# Patient Record
Sex: Female | Born: 1995 | Race: White | Hispanic: No | Marital: Single | Smoking: Never smoker
Health system: Southern US, Community
[De-identification: ages and names within clinical notes are randomized; demographics above are authoritative.]

---
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
## 2014-11-12 ENCOUNTER — Encounter: Payer: Self-pay | Admitting: Podiatry

## 2014-11-12 ENCOUNTER — Ambulatory Visit (INDEPENDENT_AMBULATORY_CARE_PROVIDER_SITE_OTHER): Payer: Commercial Managed Care - PPO

## 2014-11-12 ENCOUNTER — Ambulatory Visit (INDEPENDENT_AMBULATORY_CARE_PROVIDER_SITE_OTHER): Payer: Commercial Managed Care - PPO | Admitting: Podiatry

## 2014-11-12 VITALS — BP 111/65 | HR 88 | Resp 16 | Ht 62.0 in | Wt 152.0 lb

## 2014-11-12 DIAGNOSIS — S93402A Sprain of unspecified ligament of left ankle, initial encounter: Secondary | ICD-10-CM

## 2014-11-12 DIAGNOSIS — M779 Enthesopathy, unspecified: Secondary | ICD-10-CM

## 2014-11-12 DIAGNOSIS — M722 Plantar fascial fibromatosis: Secondary | ICD-10-CM

## 2014-11-12 NOTE — Patient Instructions (Signed)
Plantar Fasciitis (Heel Spur Syndrome) with Rehab The plantar fascia is a fibrous, ligament-like, soft-tissue structure that spans the bottom of the foot. Plantar fasciitis is a condition that causes pain in the foot due to inflammation of the tissue. SYMPTOMS   Pain and tenderness on the underneath side of the foot.  Pain that worsens with standing or walking. CAUSES  Plantar fasciitis is caused by irritation and injury to the plantar fascia on the underneath side of the foot. Common mechanisms of injury include:  Direct trauma to bottom of the foot.  Damage to a small nerve that runs under the foot where the main fascia attaches to the heel bone.  Stress placed on the plantar fascia due to bone spurs. RISK INCREASES WITH:   Activities that place stress on the plantar fascia (running, jumping, pivoting, or cutting).  Poor strength and flexibility.  Improperly fitted shoes.  Tight calf muscles.  Flat feet.  Failure to warm-up properly before activity.  Obesity. PREVENTION  Warm up and stretch properly before activity.  Allow for adequate recovery between workouts.  Maintain physical fitness:  Strength, flexibility, and endurance.  Cardiovascular fitness.  Maintain a health body weight.  Avoid stress on the plantar fascia.  Wear properly fitted shoes, including arch supports for individuals who have flat feet. PROGNOSIS  If treated properly, then the symptoms of plantar fasciitis usually resolve without surgery. However, occasionally surgery is necessary. RELATED COMPLICATIONS   Recurrent symptoms that may result in a chronic condition.  Problems of the lower back that are caused by compensating for the injury, such as limping.  Pain or weakness of the foot during push-off following surgery.  Chronic inflammation, scarring, and partial or complete fascia tear, occurring more often from repeated injections. TREATMENT  Treatment initially involves the use of  ice and medication to help reduce pain and inflammation. The use of strengthening and stretching exercises may help reduce pain with activity, especially stretches of the Achilles tendon. These exercises may be performed at home or with a therapist. Your caregiver may recommend that you use heel cups of arch supports to help reduce stress on the plantar fascia. Occasionally, corticosteroid injections are given to reduce inflammation. If symptoms persist for greater than 6 months despite non-surgical (conservative), then surgery may be recommended.  MEDICATION   If pain medication is necessary, then nonsteroidal anti-inflammatory medications, such as aspirin and ibuprofen, or other minor pain relievers, such as acetaminophen, are often recommended.  Do not take pain medication within 7 days before surgery.  Prescription pain relievers may be given if deemed necessary by your caregiver. Use only as directed and only as much as you need.  Corticosteroid injections may be given by your caregiver. These injections should be reserved for the most serious cases, because they may only be given a certain number of times. HEAT AND COLD  Cold treatment (icing) relieves pain and reduces inflammation. Cold treatment should be applied for 10 to 15 minutes every 2 to 3 hours for inflammation and pain and immediately after any activity that aggravates your symptoms. Use ice packs or massage the area with a piece of ice (ice massage).  Heat treatment may be used prior to performing the stretching and strengthening activities prescribed by your caregiver, physical therapist, or athletic trainer. Use a heat pack or soak the injury in warm water. SEEK IMMEDIATE MEDICAL CARE IF:  Treatment seems to offer no benefit, or the condition worsens.  Any medications produce adverse side effects. EXERCISES RANGE   OF MOTION (ROM) AND STRETCHING EXERCISES - Plantar Fasciitis (Heel Spur Syndrome) These exercises may help you  when beginning to rehabilitate your injury. Your symptoms may resolve with or without further involvement from your physician, physical therapist or athletic trainer. While completing these exercises, remember:   Restoring tissue flexibility helps normal motion to return to the joints. This allows healthier, less painful movement and activity.  An effective stretch should be held for at least 30 seconds.  A stretch should never be painful. You should only feel a gentle lengthening or release in the stretched tissue. RANGE OF MOTION - Toe Extension, Flexion  Sit with your right / left leg crossed over your opposite knee.  Grasp your toes and gently pull them back toward the top of your foot. You should feel a stretch on the bottom of your toes and/or foot.  Hold this stretch for __________ seconds.  Now, gently pull your toes toward the bottom of your foot. You should feel a stretch on the top of your toes and or foot.  Hold this stretch for __________ seconds. Repeat __________ times. Complete this stretch __________ times per day.  RANGE OF MOTION - Ankle Dorsiflexion, Active Assisted  Remove shoes and sit on a chair that is preferably not on a carpeted surface.  Place right / left foot under knee. Extend your opposite leg for support.  Keeping your heel down, slide your right / left foot back toward the chair until you feel a stretch at your ankle or calf. If you do not feel a stretch, slide your bottom forward to the edge of the chair, while still keeping your heel down.  Hold this stretch for __________ seconds. Repeat __________ times. Complete this stretch __________ times per day.  STRETCH - Gastroc, Standing  Place hands on wall.  Extend right / left leg, keeping the front knee somewhat bent.  Slightly point your toes inward on your back foot.  Keeping your right / left heel on the floor and your knee straight, shift your weight toward the wall, not allowing your back to  arch.  You should feel a gentle stretch in the right / left calf. Hold this position for __________ seconds. Repeat __________ times. Complete this stretch __________ times per day. STRETCH - Soleus, Standing  Place hands on wall.  Extend right / left leg, keeping the other knee somewhat bent.  Slightly point your toes inward on your back foot.  Keep your right / left heel on the floor, bend your back knee, and slightly shift your weight over the back leg so that you feel a gentle stretch deep in your back calf.  Hold this position for __________ seconds. Repeat __________ times. Complete this stretch __________ times per day. STRETCH - Gastrocsoleus, Standing  Note: This exercise can place a lot of stress on your foot and ankle. Please complete this exercise only if specifically instructed by your caregiver.   Place the ball of your right / left foot on a step, keeping your other foot firmly on the same step.  Hold on to the wall or a rail for balance.  Slowly lift your other foot, allowing your body weight to press your heel down over the edge of the step.  You should feel a stretch in your right / left calf.  Hold this position for __________ seconds.  Repeat this exercise with a slight bend in your right / left knee. Repeat __________ times. Complete this stretch __________ times per day.    STRENGTHENING EXERCISES - Plantar Fasciitis (Heel Spur Syndrome)  These exercises may help you when beginning to rehabilitate your injury. They may resolve your symptoms with or without further involvement from your physician, physical therapist or athletic trainer. While completing these exercises, remember:   Muscles can gain both the endurance and the strength needed for everyday activities through controlled exercises.  Complete these exercises as instructed by your physician, physical therapist or athletic trainer. Progress the resistance and repetitions only as guided. STRENGTH -  Towel Curls  Sit in a chair positioned on a non-carpeted surface.  Place your foot on a towel, keeping your heel on the floor.  Pull the towel toward your heel by only curling your toes. Keep your heel on the floor.  If instructed by your physician, physical therapist or athletic trainer, add ____________________ at the end of the towel. Repeat __________ times. Complete this exercise __________ times per day. STRENGTH - Ankle Inversion  Secure one end of a rubber exercise band/tubing to a fixed object (table, pole). Loop the other end around your foot just before your toes.  Place your fists between your knees. This will focus your strengthening at your ankle.  Slowly, pull your big toe up and in, making sure the band/tubing is positioned to resist the entire motion.  Hold this position for __________ seconds.  Have your muscles resist the band/tubing as it slowly pulls your foot back to the starting position. Repeat __________ times. Complete this exercises __________ times per day.  Document Released: 10/16/2005 Document Revised: 01/08/2012 Document Reviewed: 01/28/2009 [employer] Patient Information 2015 [employer]. This information is not intended to replace advice given to you by your health care provider. Make sure you discuss any questions you have with your health care provider.  

## 2014-11-12 NOTE — Progress Notes (Signed)
Subjective:    Patient ID: Mary Macdonald, female    DOB: 21-Jul-1996, 21 y.o.   MRN: [id number]  HPI Comments: 19 year old female presents the office they with complaints of lateral left ankle pain as well as bilateral heel pain. She states that approximate one month ago she fell twisting her ankle and since she has had some discomfort along the outside aspect of the ankle. She points to the area of the distal fibula. She states that currently she does not have much symptoms however as she is walking around school the pain increases. She states that after her injury she had significant bruising to the ankle as well as the toes. She is also been spinning which seems to increase in symptoms.  She also states that she has pain to bilateral heels which is been ongoing for approximate one week. She states that she has pain in the heels after long periods of ambulation. She has no pain in the mornings. She denies any recent injury or trauma to this particular area. She's been using ice and elevating the area. No other complaints at this time.  Foot Pain      Review of Systems  Musculoskeletal:       Joint pain Difficulty walking Muscle pain  All other systems reviewed and are negative.      Objective:   Physical Exam AAO x3, NAD DP/PT pulses palpable 2/4 b/l, CRT < 3 sec Protective sensation intact with Simms Weinstein monofilament, vibratory sensation intact, Achilles tendon reflex intact. There is mild tenderness to palpation of the distal aspect of the left fibula and along the ATFL. There is mild tenderness along the course of the peroneal tendons along its insertion into the fifth metatarsal base, and there is tenderness palpation of the fifth metatarsal base. There is mild edema overlying the lateral aspect of the foot and ankle. There is pain with eversion. There is no proximal fibular pain, medial malleolus pain. No pain along the syndesmosis, deltoid ligaments.  There is  tenderness palpation overlying the plantar medial tubercle bilateral calcaneus at the insertion the plantar fascia. There is no pain with lateral compression of the calcaneus or pain the vibratory sensation bilaterally. There is no pain on the course of the Achilles tendon or along the posterior aspect of the calcaneus. There is no pain on the course of plantar fascial in the arch of the foot and the ligament appears to be intact. No other areas of pinpoint bony tenderness or pain the vibratory sensation bilaterally. No open lesions or pre-ulcerative lesions No pain with calf compression, swelling, warmth, erythema.        Assessment & Plan:  19 year old female with lateral left ankle pain, peroneal tendinitis; bilateral plantar fasciitis -X-rays were obtained and reviewed the patient. On the oblique x-ray of the left ankle there is a radiolucent line with the distal aspect of the fibula. This could represent a healing growth plate versus old fracture. However, given the nature of the patient's injury (twisting of the ankle), with pain overlying the area/ swelling, and walking a lot around campus [facility])  will immobilize and a CAM walker for 2 weeks. Immobilization should also help the heel pain/plantar fasciitis. Follow-up x-rays in 2 weeks. -In regards the heel pain discussed most likely etiology is plantar fasciitis. X-rays reviewed with the patient. At this time discussed possible steroid injection into the area however the patient wishes to hold off. Discussed other treatments which include shoe modification/orthotics, stretching, icing, anti-inflammatories as  needed. Dispense plantar fascial brace for the right. -Follow-up in 2 weeks for repeat x-rays or call sooner with any questions, concerns, change in symptoms. In 2 weeks if his symptoms have resolved to the left ankle will likely start home physical therapy for peroneal tendinitis and plantar fasciitis for that side.

## 2014-11-26 ENCOUNTER — Ambulatory Visit (INDEPENDENT_AMBULATORY_CARE_PROVIDER_SITE_OTHER): Payer: 59

## 2014-11-26 ENCOUNTER — Ambulatory Visit (INDEPENDENT_AMBULATORY_CARE_PROVIDER_SITE_OTHER): Payer: 59 | Admitting: Podiatry

## 2014-11-26 ENCOUNTER — Ambulatory Visit: Payer: Commercial Managed Care - PPO | Admitting: Podiatry

## 2014-11-26 ENCOUNTER — Encounter: Payer: Self-pay | Admitting: Podiatry

## 2014-11-26 VITALS — BP 105/70 | HR 72 | Resp 18

## 2014-11-26 DIAGNOSIS — R52 Pain, unspecified: Secondary | ICD-10-CM

## 2014-11-26 DIAGNOSIS — M779 Enthesopathy, unspecified: Secondary | ICD-10-CM

## 2014-11-26 MED ORDER — DICLOFENAC SODIUM 1 % TD GEL: 2.0000 g | Freq: Four times a day (QID) | TRANSDERMAL | Status: DC

## 2014-12-01 NOTE — Progress Notes (Signed)
Patient ID: Mary Macdonald, female   DOB: [birth year], 19 y.o.   MRN: [id number]  Subjective: 19 year old female returns the office they fall evaluation of left ankle pain as well as bilateral heel pain. She states that her heels up significantly improved compared to last appointment and she has no discomfort at this time. She states of the ankles also doing well on the left side however she does continue to have some discomfort on the top of her foot for which she points to the more lateral aspect of the left foot. She's been continuing the CAM walker. Denies any systemic complaints such as fevers, chills, nausea, vomiting. No acute changes since last appointment, and no other complaints at this time.   Objective: AAO x3, NAD DP/PT pulses palpable bilaterally, CRT less than 3 seconds Protective sensation intact with Simms Weinstein monofilament, vibratory sensation intact, Achilles tendon reflex intact There is mild discomfort along the course in the insertion of the peroneal tendon on the left foot. There is no pain with eversion or range of motion. The peroneal tendons appear to be intact. There is no tenderness along the plantar medial tubercle of the calcaneus at the insertion the plantar fascia or with lateral compression of the calcaneus bilaterally. No pain on the posterior aspect of the calcaneus bilaterally. There is no tenderness along the course of the fibula, tibia, ankle ligaments. Ankle joint range of motion is intact. No other areas of pinpoint bony tenderness or pain with vibratory sensation to bilateral lower extremities. MMT 5/5, ROM WNL. No edema, erythema, increase in warmth to bilateral lower extremities.  No open lesions or pre-ulcerative lesions.  No pain with calf compression, swelling, warmth, erythema  Assessment: 19 year old female with resolved bilateral heel pain, likely plantar fasciitis; likely left foot peroneal tendinitis  Plan: -X-rays were obtained and reviewed  of the left foot. -All treatment options discussed with the patient including all alternatives, risks, complications.  -At this time she consented transition back into a regular shoe as tolerated. I discussed with her stretching/strengthening exercises of the peroneal tendons. Continue with ice. Prescribed Voltaren gel. -Continue stretching exercises for plantar fasciitis and ice to the area. Continue with plantar fascial brace as needed. -Follow-up at the symptoms are not resolved within 4 weeks or sooner should any problems arise. -Patient encouraged to call the office with any questions, concerns, change in symptoms.

## 2017-12-31 ENCOUNTER — Ambulatory Visit
Admission: RE | Admit: 2017-12-31 | Discharge: 2017-12-31 | Disposition: A | Discharge status: Home / Self Care | Payer: BC Managed Care – PPO | Source: Ambulatory Visit | Attending: Family Medicine | Admitting: Family Medicine

## 2017-12-31 ENCOUNTER — Other Ambulatory Visit: Payer: Self-pay | Admitting: Family Medicine

## 2017-12-31 DIAGNOSIS — R059 Cough, unspecified: Secondary | ICD-10-CM

## 2017-12-31 DIAGNOSIS — R918 Other nonspecific abnormal finding of lung field: Secondary | ICD-10-CM | POA: Diagnosis not present

## 2017-12-31 DIAGNOSIS — R05 Cough: Secondary | ICD-10-CM

## 2019-08-12 ENCOUNTER — Other Ambulatory Visit: Payer: Self-pay

## 2019-08-12 DIAGNOSIS — Z20822 Contact with and (suspected) exposure to covid-19: Secondary | ICD-10-CM

## 2019-08-14 LAB — NOVEL CORONAVIRUS, NAA: SARS-CoV-2, NAA: NOT DETECTED

## 2020-01-29 IMAGING — CR DG CHEST 2V
2 series · 2 of 2 positions shown · non-contrast
Comparison: None.

CLINICAL DATA: Cough.

EXAM:
CHEST  2 VIEW

[chest pa]
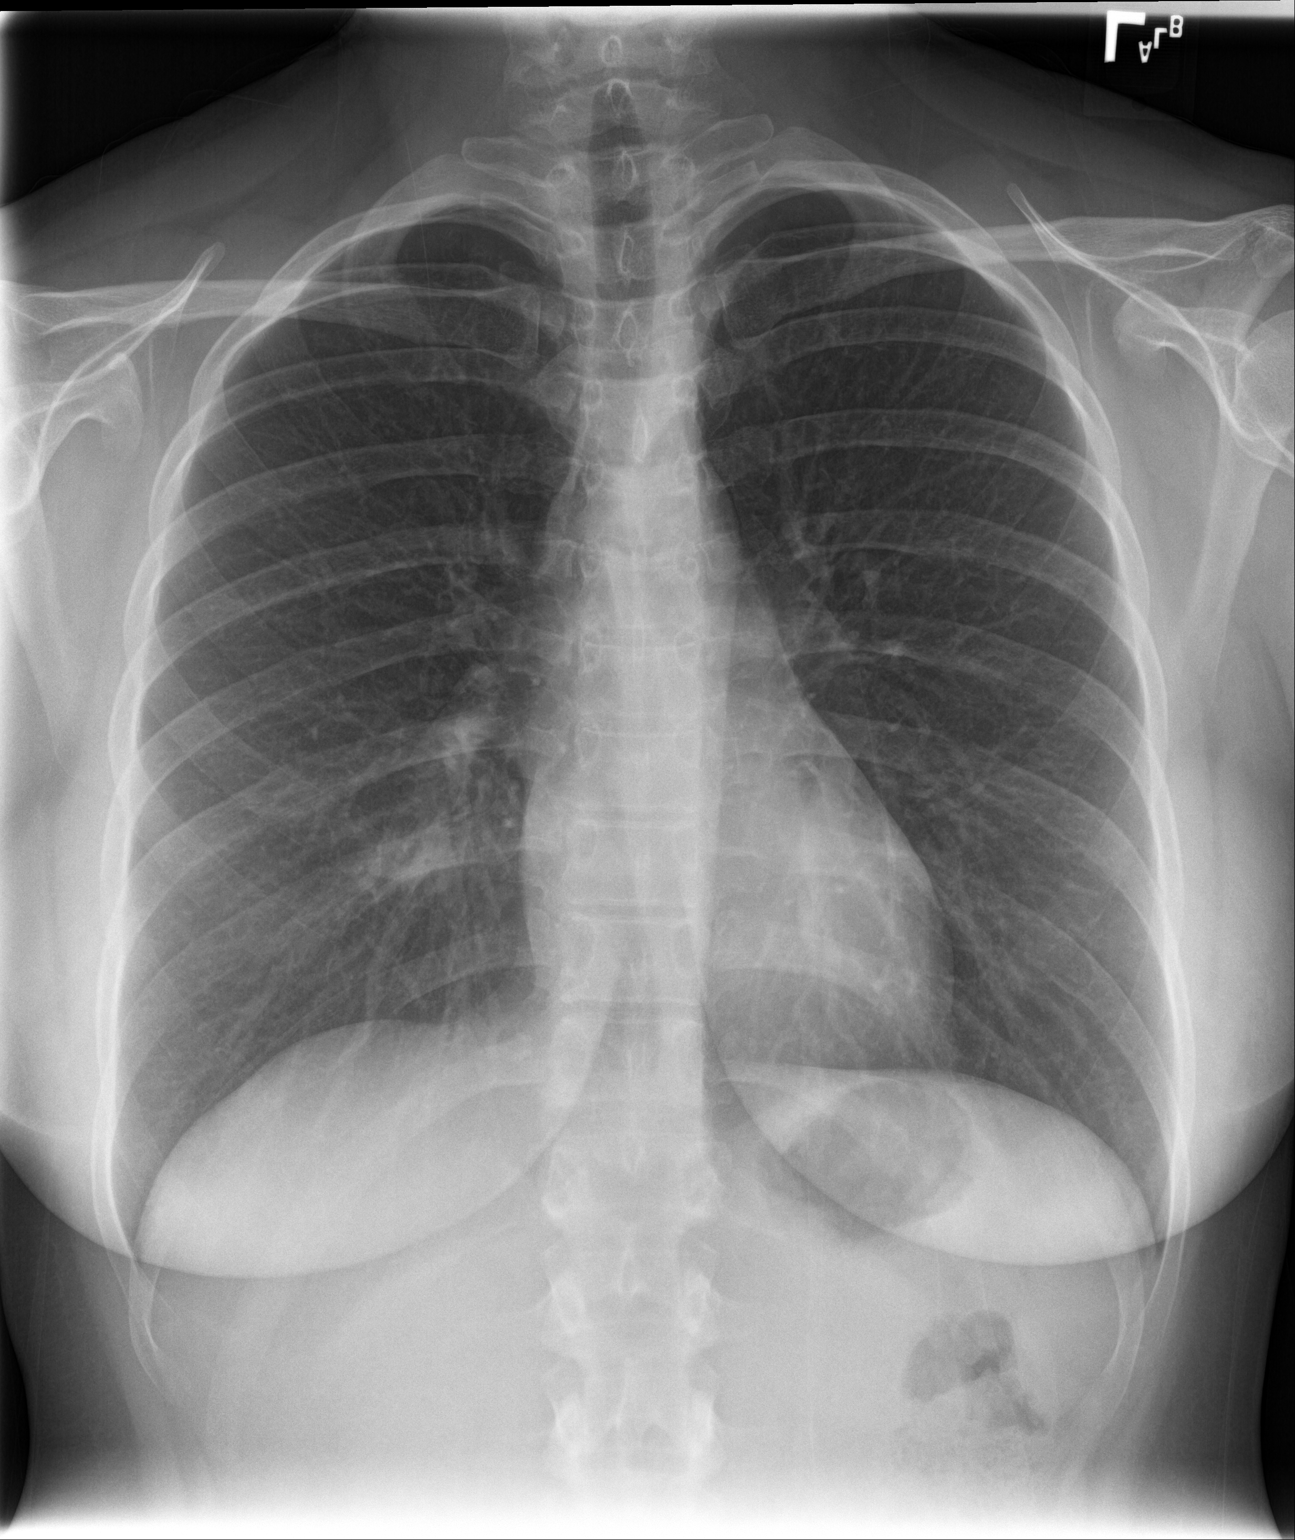

[chest lat]
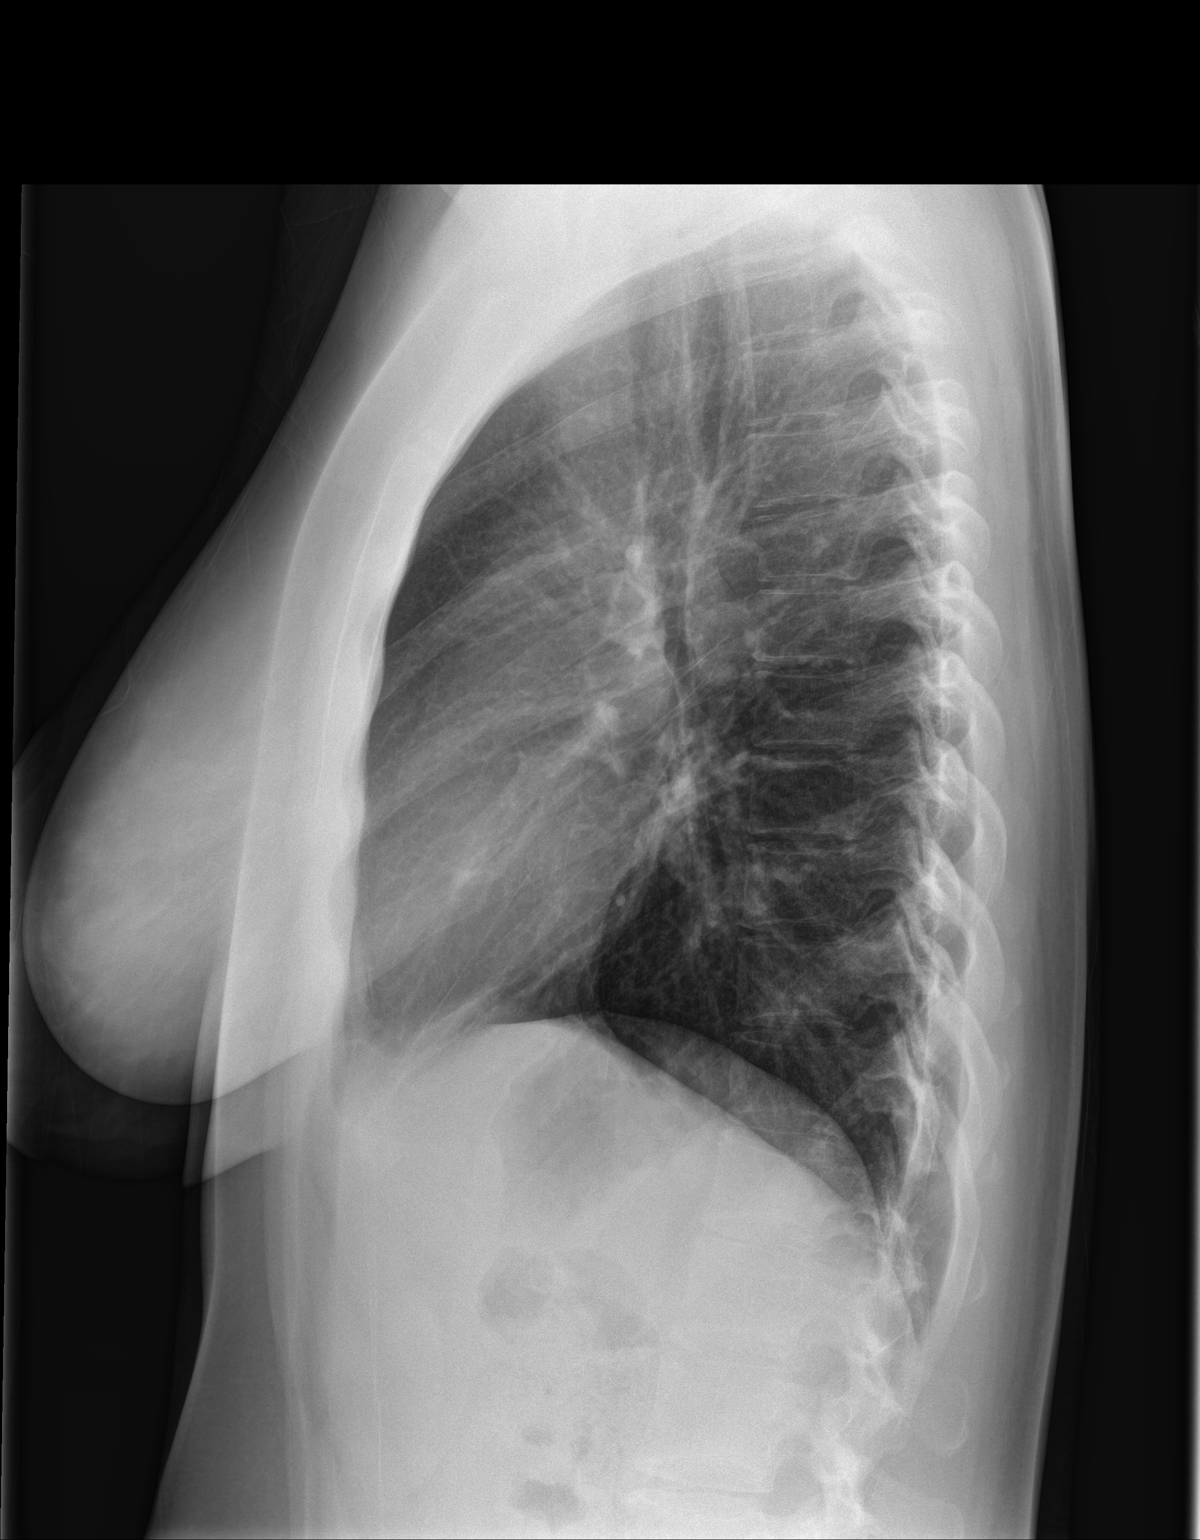

[2 of 2 positions shown; findings below may reference images not displayed]

FINDINGS: The cardiomediastinal contours are normal. Small focal opacity in
the right middle lobe. Pulmonary vasculature is normal. No pleural
effusion or pneumothorax. No acute osseous abnormalities are seen.
IMPRESSION: Small right middle lobe opacity may be atelectasis or pneumonia in
the setting of cough.

## 2020-04-14 ENCOUNTER — Ambulatory Visit: Payer: BC Managed Care – PPO | Admitting: Podiatry

## 2020-04-15 ENCOUNTER — Ambulatory Visit: Payer: BC Managed Care – PPO | Admitting: Podiatry

## 2020-04-15 ENCOUNTER — Encounter: Payer: Self-pay | Admitting: Podiatry

## 2020-04-15 ENCOUNTER — Ambulatory Visit (INDEPENDENT_AMBULATORY_CARE_PROVIDER_SITE_OTHER): Payer: BC Managed Care – PPO

## 2020-04-15 ENCOUNTER — Other Ambulatory Visit: Payer: Self-pay

## 2020-04-15 DIAGNOSIS — M778 Other enthesopathies, not elsewhere classified: Secondary | ICD-10-CM

## 2020-04-15 DIAGNOSIS — M7672 Peroneal tendinitis, left leg: Secondary | ICD-10-CM

## 2020-04-16 ENCOUNTER — Encounter: Payer: Self-pay | Admitting: Podiatry

## 2020-04-16 NOTE — Progress Notes (Signed)
Subjective:  Patient ID: Mary Macdonald, female    DOB: [birth year],  MRN: [id number]  Chief Complaint  Patient presents with  . Plantar Warts    i have a spot on the ball of the right foot and hurts with walking   . Foot Problem    the left foot hurts on the outside    24 y.o. female presents with the above complaint.  Patient presents with complaint of left lateral foot hurting a lot on the side.  Patient states been going on since January 2021.  Is bad at the end of the school day.  There is some soreness and tenderness associated with it.  Patient states it hurts when working out.  There is no burning.  There is some throbbing sensation.  It hurts to walk and while stretching.  She heard a pop that is how she feels.  No swelling associated with it.  No injury.  Not diabetic.  She denies any other acute complaints.  She has not seen anyone else prior to see me.  Her pain scale 6 out of 10.   Review of Systems: Negative except as noted in the HPI. Denies N/V/F/Ch.  History reviewed. No pertinent past medical history.  Current Outpatient Medications:  .  nortriptyline (PAMELOR) 25 MG capsule, Take by mouth., Disp: , Rfl:  .  Magnesium Oxide 500 MG TABS, Take by mouth., Disp: , Rfl:  .  SUMAtriptan (IMITREX) 100 MG tablet, Take by mouth., Disp: , Rfl:  .  Topiramate ER (TROKENDI XR) 100 MG CP24, Take by mouth., Disp: , Rfl:   Social History   Tobacco Use  Smoking Status Never Smoker  Smokeless Tobacco Never Used    No Known Allergies Objective:  There were no vitals filed for this visit. There is no height or weight on file to calculate BMI. Constitutional Well developed. Well nourished.  Vascular Dorsalis pedis pulses palpable bilaterally. Posterior tibial pulses palpable bilaterally. Capillary refill normal to all digits.  No cyanosis or clubbing noted. Pedal hair growth normal.  Neurologic Normal speech. Oriented to person, place, and time. Epicritic sensation to  light touch grossly present bilaterally.  Dermatologic Nails well groomed and normal in appearance. No open wounds. No skin lesions.  Orthopedic:  Pain on palpation along the course of the left peroneal tendon.  Pain at the insertion as well as along the course of the tendon.  No retromalleolar peroneal tendon pain noted.  Pain with plantarflexion and inversion of the left foot.  No pain with dorsiflexion and eversion of the foot.  These findings were consistent with active and passive range of motion.   Radiographs: 3 views of skeletally mature adult left foot:No osseous abnormalities noted.  No fractures noted.  No other bony on abnormalities identified.  Spurring noted  Assessment:   1. Capsulitis of foot, left   2. Peroneal tendinitis of left lower extremity    Plan:  Patient was evaluated and treated and all questions answered.  Left peroneal tendinitis -I explained patient the etiology of peroneal tendinitis and various treatment options were extensively discussed.  Given that she is constantly on her feet I think believe that the tendon may have been aggravated.  Given her she has moderate to severe pain.  I believe patient will benefit from a steroid injection to help decrease the acute inflammatory component associated pain.  I did discuss with the patient that there is a risk of rupture associated with steroid injection near the tendon.  Patient would like to proceed with injection despite the risks.  At this time I will hold off on a cam boot immobilization however if there is no improvement with a steroid injection we will plan on a cam boot immobilization. -A steroid injection was performed at left lateral foot a point of maximal tenderness using 1% plain Lidocaine and 10 mg of Kenalog. This was well tolerated.   No follow-ups on file.

## 2020-05-18 ENCOUNTER — Ambulatory Visit: Payer: BC Managed Care – PPO | Admitting: Podiatry

## 2020-05-18 ENCOUNTER — Other Ambulatory Visit: Payer: Self-pay

## 2020-05-18 DIAGNOSIS — M7672 Peroneal tendinitis, left leg: Secondary | ICD-10-CM

## 2020-05-19 ENCOUNTER — Encounter: Payer: Self-pay | Admitting: Podiatry

## 2020-05-19 NOTE — Progress Notes (Signed)
  Subjective:  Patient ID: Mary Macdonald, female    DOB: September 25, 1996,  MRN: [id number]  Chief Complaint  Patient presents with  . Foot Pain    pt is here for possible capsulitis of the left foot.    24 y.o. female presents with the above complaint.  Patient presents with continuous pain to the left peroneal tendinitis.  Patient states the injection did not help.  Patient states the cam boot immobilization has not done anything either.  She states that she got new shoes which seems to help a little bit more.  Her pain scale 7 out of 10.  Patient does not want any further injections.  She would like to discuss what her next options are.   Review of Systems: Negative except as noted in the HPI. Denies N/V/F/Ch.  No past medical history on file.  Current Outpatient Medications:  .  EPIDUO FORTE 0.3-2.5 % GEL, Apply 1 application topically at bedtime., Disp: , Rfl:  .  Magnesium Oxide 500 MG TABS, Take by mouth., Disp: , Rfl:  .  nortriptyline (PAMELOR) 25 MG capsule, Take by mouth., Disp: , Rfl:  .  SUMAtriptan (IMITREX) 100 MG tablet, Take by mouth., Disp: , Rfl:  .  Topiramate ER (TROKENDI XR) 100 MG CP24, Take by mouth., Disp: , Rfl:   Social History   Tobacco Use  Smoking Status Never Smoker  Smokeless Tobacco Never Used    No Known Allergies Objective:  There were no vitals filed for this visit. There is no height or weight on file to calculate BMI. Constitutional Well developed. Well nourished.  Vascular Dorsalis pedis pulses palpable bilaterally. Posterior tibial pulses palpable bilaterally. Capillary refill normal to all digits.  No cyanosis or clubbing noted. Pedal hair growth normal.  Neurologic Normal speech. Oriented to person, place, and time. Epicritic sensation to light touch grossly present bilaterally.  Dermatologic Nails well groomed and normal in appearance. No open wounds. No skin lesions.  Orthopedic:  Pain on palpation along the course of the left  peroneal tendon.  Pain at the insertion as well as along the course of the tendon.  No retromalleolar peroneal tendon pain noted.  Pain with plantarflexion and inversion of the left foot.  No pain with dorsiflexion and eversion of the foot.  These findings were consistent with active and passive range of motion.   Radiographs: 3 views of skeletally mature adult left foot:No osseous abnormalities noted.  No fractures noted.  No other bony on abnormalities identified.  Spurring noted  Assessment:   1. Peroneal tendinitis of left lower extremity    Plan:  Patient was evaluated and treated and all questions answered.  Left peroneal tendinitis -Patient had placed herself in the cam boot because of continuous pain which helped a little bit.  The shoe gear modification has also helped.  Given clinically that her pain has not resolved I am worried about the longitudinal tearing along the course of the peroneal tendon. -I believe patient will benefit from MRI to evaluate tearing of the peroneal tendon of the left side.  Patient agrees with the plan would like to proceed with obtaining MRI. -She will continue wearing the cam boot in the meantime. -I will see her back again in 4 weeks.    No follow-ups on file.

## 2020-05-21 ENCOUNTER — Telehealth: Payer: Self-pay

## 2020-05-21 DIAGNOSIS — M7672 Peroneal tendinitis, left leg: Secondary | ICD-10-CM

## 2020-05-21 NOTE — Telephone Encounter (Signed)
-----   Message from Candelaria Stagers, DPM sent at 05/19/2020  7:19 AM EDT ----- Regarding: MRI left ankle Hi Angie,  Would you be able to order MRI of the left ankle to evaluate the peroneal tendon to see if there is any tendon tearing along it.  Thank you

## 2020-05-21 NOTE — Telephone Encounter (Signed)
MRI approved from 05/21/2020 to 11/16/2020 Auth # [id number] Patient has been notified and will request the scheduling number through Mychart to call and schedule at her convenience

## 2020-06-15 ENCOUNTER — Ambulatory Visit: Payer: BC Managed Care – PPO | Admitting: Podiatry

## 2020-06-17 ENCOUNTER — Ambulatory Visit: Admission: RE | Admit: 2020-06-17 | Payer: BC Managed Care – PPO | Source: Ambulatory Visit

## 2020-06-17 ENCOUNTER — Other Ambulatory Visit: Payer: Self-pay

## 2020-07-06 ENCOUNTER — Ambulatory Visit: Payer: BC Managed Care – PPO | Admitting: Podiatry

## 2020-09-08 ENCOUNTER — Other Ambulatory Visit: Payer: Self-pay

## 2020-09-08 ENCOUNTER — Ambulatory Visit
Admission: EM | Admit: 2020-09-08 | Discharge: 2020-09-08 | Disposition: A | Discharge status: Home / Self Care | Payer: BC Managed Care – PPO | Attending: Emergency Medicine | Admitting: Emergency Medicine

## 2020-09-08 ENCOUNTER — Encounter: Payer: Self-pay | Admitting: Emergency Medicine

## 2020-09-08 DIAGNOSIS — Z79899 Other long term (current) drug therapy: Secondary | ICD-10-CM | POA: Insufficient documentation

## 2020-09-08 DIAGNOSIS — R000 Tachycardia, unspecified: Secondary | ICD-10-CM | POA: Diagnosis not present

## 2020-09-08 DIAGNOSIS — Z20822 Contact with and (suspected) exposure to covid-19: Secondary | ICD-10-CM | POA: Insufficient documentation

## 2020-09-08 DIAGNOSIS — J029 Acute pharyngitis, unspecified: Secondary | ICD-10-CM | POA: Insufficient documentation

## 2020-09-08 DIAGNOSIS — H66002 Acute suppurative otitis media without spontaneous rupture of ear drum, left ear: Secondary | ICD-10-CM | POA: Insufficient documentation

## 2020-09-08 DIAGNOSIS — Z8616 Personal history of COVID-19: Secondary | ICD-10-CM | POA: Insufficient documentation

## 2020-09-08 LAB — SARS CORONAVIRUS 2 (TAT 6-24 HRS): SARS Coronavirus 2: NEGATIVE

## 2020-09-08 LAB — GROUP A STREP BY PCR: Group A Strep by PCR: NOT DETECTED

## 2020-09-08 MED ORDER — AMOXICILLIN-POT CLAVULANATE 875-125 MG PO TABS: 1.0000 | ORAL_TABLET | Freq: Two times a day (BID) | ORAL | 0 refills | Status: AC

## 2020-09-08 NOTE — ED Triage Notes (Signed)
Pt c/o sore throat, cough, bilateral ear pain left worse than right, post nasal drip and nasal congestion. Started about 4 days ago. She states she went to her PCP yesterday and had a strep test but she does not believe they did it right. She has had covid vaccines.

## 2020-09-08 NOTE — ED Provider Notes (Signed)
[facility]    CSN: [id number] Arrival date & time: 09/08/20  8413      History   Chief Complaint Chief Complaint  Patient presents with  . Sore Throat  . Otalgia    bilateral L>R    HPI Mary Macdonald is a 24 y.o. female.   Mary Macdonald presents with complaints of sore throat and left ear pain. Sore throat started initially 3 days ago. Last night she noted ear "popping" and pressure. Decreased hearing from left ear. Right ear feels congested. No fever. No headache or body aches. No cough. Some nasal drainage. No gi symptoms. No skin rash. Has been taking sudafed as well as cough medication, dayquil/nyquil, honey, tea, compresses to ears, gargling, all which haven't helped. History of strep throat, typically twice a year. No known ill contacts. Works as a Runner, broadcasting/film/video. Has had covid-19 and has been vaccinated for covid-19. Has had tachycardia since having covid-19.     ROS per HPI, negative if not otherwise mentioned.      History reviewed. No pertinent past medical history.  There are no problems to display for this patient.   History reviewed. No pertinent surgical history.  OB History   No obstetric history on file.      Home Medications    Prior to Admission medications   Medication Sig Start Date End Date Taking? Authorizing Provider  norethindrone (MICRONOR) 0.35 MG tablet  09/01/20  Yes [provider]  SUMAtriptan (IMITREX) 100 MG tablet Take by mouth.   Yes [provider]  amoxicillin-clavulanate (AUGMENTIN) 875-125 MG tablet Take 1 tablet by mouth 2 (two) times daily for 5 days. 09/08/20 09/13/20  Georgetta Haber, NP  EPIDUO FORTE 0.3-2.5 % GEL Apply 1 application topically at bedtime. 04/05/20   [provider]  Magnesium Oxide 500 MG TABS Take by mouth.    [provider]  nortriptyline (PAMELOR) 25 MG capsule Take by mouth. 12/04/18   [provider]  Topiramate ER (TROKENDI XR) 100 MG CP24 Take  by mouth.    [provider]    Family History Family History  Problem Relation Age of Onset  . Renal Disease Mother   . Arthritis Mother   . Diabetes Mother   . Healthy Father     Social History Social History   Tobacco Use  . Smoking status: Never Smoker  . Smokeless tobacco: Never Used  Vaping Use  . Vaping Use: Never used  Substance Use Topics  . Alcohol use: No    Alcohol/week: 0.0 standard drinks  . Drug use: Not Currently     Allergies   Patient has no known allergies.   Review of Systems Review of Systems   Physical Exam Triage Vital Signs ED Triage Vitals  Enc Vitals Group     BP 09/08/20 0902 112/69     Pulse Rate 09/08/20 0902 (!) 108     Resp 09/08/20 0902 18     Temp 09/08/20 0902 98.8 F (37.1 C)     Temp Source 09/08/20 0902 Oral     SpO2 09/08/20 0902 99 %     Weight 09/08/20 0858 151 lb 14.4 oz (68.9 kg)     Height 09/08/20 0858 5\' 2"  (1.575 m)     Head Circumference --      Peak Flow --      Pain Score 09/08/20 0857 7     Pain Loc --      Pain Edu? --  Excl. in GC? --    No data found.  Updated Vital Signs BP 112/69 (BP Location: Left Arm)   Pulse (!) 108   Temp 98.8 F (37.1 C) (Oral)   Resp 18   Ht 5\' 2"  (1.575 m)   Wt 151 lb 14.4 oz (68.9 kg)   LMP 08/24/2020   SpO2 99%   BMI 27.78 kg/m   Visual Acuity Right Eye Distance:   Left Eye Distance:   Bilateral Distance:    Right Eye Near:   Left Eye Near:    Bilateral Near:     Physical Exam Constitutional:      General: She is not in acute distress.    Appearance: She is well-developed.  HENT:     Right Ear: Ear canal normal. A middle ear effusion is present.     Left Ear: Ear canal normal. A middle ear effusion is present. Tympanic membrane is erythematous and bulging.     Mouth/Throat:     Pharynx: Posterior oropharyngeal erythema present.     Tonsils: No tonsillar exudate. 1+ on the right. 1+ on the left.  Cardiovascular:     Rate and Rhythm:  Normal rate.  Pulmonary:     Effort: Pulmonary effort is normal.  Skin:    General: Skin is warm and dry.  Neurological:     Mental Status: She is alert and oriented to person, place, and time.      UC Treatments / Results  Labs (all labs ordered are listed, but only abnormal results are displayed) Labs Reviewed  GROUP A STREP BY PCR  SARS CORONAVIRUS 2 (TAT 6-24 HRS)    EKG   Radiology No results found.  Procedures Procedures (including critical care time)  Medications Ordered in UC Medications - No data to display  Initial Impression / Assessment and Plan / UC Course  I have reviewed the triage vital signs and the nursing notes.  Pertinent labs & imaging results that were available during my care of the patient were reviewed by me and considered in my medical decision making (see chart for details).     Left AOM with augmentin provided. Negative strep testing, covid testing pending. Return precautions provided. Patient verbalized understanding and agreeable to plan.   Final Clinical Impressions(s) / UC Diagnoses   Final diagnoses:  Acute suppurative otitis media of left ear without spontaneous rupture of tympanic membrane, recurrence not specified     Discharge Instructions     Push fluids to ensure adequate hydration and keep secretions thin.  Tylenol and/or ibuprofen as needed for pain or fevers.  Complete course of antibiotics to treat left ear infection.  I will call you if your strep comes back positive, or you can also see your result on your mychart (you will simply be given additional of the same antibiotic for a longer course) Covid results in 24 hours, check your mychart for negative testing. We call if its positive.     ED Prescriptions    Medication Sig Dispense Auth. Provider   amoxicillin-clavulanate (AUGMENTIN) 875-125 MG tablet Take 1 tablet by mouth 2 (two) times daily for 5 days. 10 tablet 08/26/2020, NP     PDMP not reviewed  this encounter.   Georgetta Haber, NP 09/08/20 [phone]

## 2020-09-08 NOTE — Discharge Instructions (Signed)
Push fluids to ensure adequate hydration and keep secretions thin.  Tylenol and/or ibuprofen as needed for pain or fevers.  Complete course of antibiotics to treat left ear infection.  I will call you if your strep comes back positive, or you can also see your result on your mychart (you will simply be given additional of the same antibiotic for a longer course) Covid results in 24 hours, check your mychart for negative testing. We call if its positive.
